# Patient Record
Sex: Male | Born: 1985 | Race: Black or African American | Hispanic: No | Marital: Single | Smoking: Never smoker
Health system: Southern US, Community
[De-identification: ages and names within clinical notes are randomized; demographics above are authoritative.]

## PROBLEM LIST (undated history)

## (undated) DIAGNOSIS — Z789 Other specified health status: Secondary | ICD-10-CM

## (undated) HISTORY — DX: Other specified health status: Z78.9

## (undated) HISTORY — PX: OTHER SURGICAL HISTORY: SHX169

---

## 2017-01-12 ENCOUNTER — Other Ambulatory Visit: Payer: Self-pay | Admitting: Internal Medicine

## 2017-01-12 ENCOUNTER — Ambulatory Visit
Admission: RE | Admit: 2017-01-12 | Discharge: 2017-01-12 | Disposition: A | Discharge status: Home / Self Care | Payer: No Typology Code available for payment source | Source: Ambulatory Visit | Attending: Internal Medicine | Admitting: Internal Medicine

## 2017-01-12 DIAGNOSIS — R7611 Nonspecific reaction to tuberculin skin test without active tuberculosis: Secondary | ICD-10-CM

## 2018-09-22 ENCOUNTER — Emergency Department (HOSPITAL_COMMUNITY)
Admission: EM | Admit: 2018-09-22 | Discharge: 2018-09-22 | Disposition: A | Discharge status: Home / Self Care | Payer: Self-pay | Attending: Emergency Medicine | Admitting: Emergency Medicine

## 2018-09-22 ENCOUNTER — Encounter (HOSPITAL_COMMUNITY): Payer: Self-pay | Admitting: *Deleted

## 2018-09-22 ENCOUNTER — Other Ambulatory Visit: Payer: Self-pay

## 2018-09-22 ENCOUNTER — Emergency Department (HOSPITAL_COMMUNITY): Payer: Self-pay

## 2018-09-22 DIAGNOSIS — R05 Cough: Secondary | ICD-10-CM | POA: Insufficient documentation

## 2018-09-22 DIAGNOSIS — J069 Acute upper respiratory infection, unspecified: Secondary | ICD-10-CM

## 2018-09-22 DIAGNOSIS — R059 Cough, unspecified: Secondary | ICD-10-CM

## 2018-09-22 MED ORDER — ACETAMINOPHEN 325 MG PO TABS
650.0000 mg | ORAL_TABLET | Freq: Once | ORAL | Status: AC
  Administered 2018-09-22: 650 mg via ORAL
  Filled 2018-09-22: qty 2

## 2018-09-22 NOTE — Discharge Instructions (Addendum)
Person Under Monitoring Name: Philip Bell  Location: [address] [place] [address]   Infection Prevention Recommendations for Individuals Confirmed to have, or Being Evaluated for, 2019 Novel Coronavirus (COVID-19) Infection Who Receive Care at Home  Individuals who are confirmed to have, or are being evaluated for, COVID-19 should follow the prevention steps below until a healthcare provider or local or state health department says they can return to normal activities.  Stay home except to get medical care You should restrict activities outside your home, except for getting medical care. Do not go to work, school, or public areas, and do not use public transportation or taxis.  Call ahead before visiting your doctor Before your medical appointment, call the healthcare provider and tell them that you have, or are being evaluated for, COVID-19 infection. This will help the healthcare providers office take steps to keep other people from getting infected. Ask your healthcare provider to call the local or state health department.  Monitor your symptoms Seek prompt medical attention if your illness is worsening (e.g., difficulty breathing). Before going to your medical appointment, call the healthcare provider and tell them that you have, or are being evaluated for, COVID-19 infection. Ask your healthcare provider to call the local or state health department.  Wear a facemask You should wear a facemask that covers your nose and mouth when you are in the same room with other people and when you visit a healthcare provider. People who live with or visit you should also wear a facemask while they are in the same room with you.  Separate yourself from other people in your home As much as possible, you should stay in a different room from other people in your home. Also, you should use a separate bathroom, if available.  Avoid sharing household items You should  not share dishes, drinking glasses, cups, eating utensils, towels, bedding, or other items with other people in your home. After using these items, you should wash them thoroughly with soap and water.  Cover your coughs and sneezes Cover your mouth and nose with a tissue when you cough or sneeze, or you can cough or sneeze into your sleeve. Throw used tissues in a lined trash can, and immediately wash your hands with soap and water for at least 20 seconds or use an alcohol-based hand rub.  Wash your [employer] your hands often and thoroughly with soap and water for at least 20 seconds. You can use an alcohol-based hand sanitizer if soap and water are not available and if your hands are not visibly dirty. Avoid touching your eyes, nose, and mouth with unwashed hands.   Prevention Steps for Caregivers and Household Members of Individuals Confirmed to have, or Being Evaluated for, COVID-19 Infection Being Cared for in the Home  If you live with, or provide care at home for, a person confirmed to have, or being evaluated for, COVID-19 infection please follow these guidelines to prevent infection:  Follow healthcare providers instructions Make sure that you understand and can help the patient follow any healthcare provider instructions for all care.  Provide for the patients basic needs You should help the patient with basic needs in the home and provide support for getting groceries, prescriptions, and other personal needs.  Monitor the patients symptoms If they are getting sicker, call his or her medical provider and tell them that the patient has, or is being evaluated for, COVID-19 infection. This will help the healthcare providers  office take steps to keep other people from getting infected. Ask the healthcare provider to call the local or state health department.  Limit the number of people who have contact with the patient If possible, have only one caregiver for the  patient. Other household members should stay in another home or place of residence. If this is not possible, they should stay in another room, or be separated from the patient as much as possible. Use a separate bathroom, if available. Restrict visitors who do not have an essential need to be in the home.  Keep older adults, very young children, and other sick people away from the patient Keep older adults, very young children, and those who have compromised immune systems or chronic health conditions away from the patient. This includes people with chronic heart, lung, or kidney conditions, diabetes, and cancer.  Ensure good ventilation Make sure that shared spaces in the home have good air flow, such as from an air conditioner or an opened window, weather permitting.  Wash your hands often Wash your hands often and thoroughly with soap and water for at least 20 seconds. You can use an alcohol based hand sanitizer if soap and water are not available and if your hands are not visibly dirty. Avoid touching your eyes, nose, and mouth with unwashed hands. Use disposable paper towels to dry your hands. If not available, use dedicated cloth towels and replace them when they become wet.  Wear a facemask and gloves Wear a disposable facemask at all times in the room and gloves when you touch or have contact with the patients blood, body fluids, and/or secretions or excretions, such as sweat, saliva, sputum, nasal mucus, vomit, urine, or feces.  Ensure the mask fits over your nose and mouth tightly, and do not touch it during use. Throw out disposable facemasks and gloves after using them. Do not reuse. Wash your hands immediately after removing your facemask and gloves. If your personal clothing becomes contaminated, carefully remove clothing and launder. Wash your hands after handling contaminated clothing. Place all used disposable facemasks, gloves, and other waste in a lined container before  disposing them with other household waste. Remove gloves and wash your hands immediately after handling these items.  Do not share dishes, glasses, or other household items with the patient Avoid sharing household items. You should not share dishes, drinking glasses, cups, eating utensils, towels, bedding, or other items with a patient who is confirmed to have, or being evaluated for, COVID-19 infection. After the person uses these items, you should wash them thoroughly with soap and water.  Wash laundry thoroughly Immediately remove and wash clothes or bedding that have blood, body fluids, and/or secretions or excretions, such as sweat, saliva, sputum, nasal mucus, vomit, urine, or feces, on them. Wear gloves when handling laundry from the patient. Read and follow directions on labels of laundry or clothing items and detergent. In general, wash and dry with the warmest temperatures recommended on the label.  Clean all areas the individual has used often Clean all touchable surfaces, such as counters, tabletops, doorknobs, bathroom fixtures, toilets, phones, keyboards, tablets, and bedside tables, every day. Also, clean any surfaces that may have blood, body fluids, and/or secretions or excretions on them. Wear gloves when cleaning surfaces the patient has come in contact with. Use a diluted bleach solution (e.g., dilute bleach with 1 part bleach and 10 parts water) or a household disinfectant with a label that says EPA-registered for coronaviruses. To make a  bleach solution at home, add 1 tablespoon of bleach to 1 quart (4 cups) of water. For a larger supply, add  cup of bleach to 1 gallon (16 cups) of water. Read labels of cleaning products and follow recommendations provided on product labels. Labels contain instructions for safe and effective use of the cleaning product including precautions you should take when applying the product, such as wearing gloves or eye protection and making sure you  have good ventilation during use of the product. Remove gloves and wash hands immediately after cleaning.  Monitor yourself for signs and symptoms of illness Caregivers and household members are considered close contacts, should monitor their health, and will be asked to limit movement outside of the home to the extent possible. Follow the monitoring steps for close contacts listed on the symptom monitoring form.   ? If you have additional questions, contact your local health department or call the epidemiologist on call at [phone] (available 24/7). ? This guidance is subject to change. For the most up-to-date guidance from [facility], please refer to their website: [email or web address]

## 2018-09-22 NOTE — ED Triage Notes (Addendum)
Pt has cough and scratchy throat for 3days. Pt reports his room mate was seen at ED for possible COVID 19  And roommate was sent home to self isolate.  PT also reports a HA.

## 2018-09-22 NOTE — ED Provider Notes (Signed)
[facility] Provider Note   CSN: [id number] Arrival date & time: 09/22/18  1433    History   Chief Complaint Chief Complaint  Patient presents with  . Cough    HPI Philip DankerMohammed Mecum is a 33 y.o. male who presents to the ED with cc of Cough. Patient had onset of symptoms yesterday. He has associated myalgias of the legs. He has a mild headache and fatigue. He denies fever, chills, nausea, vomiting. Diarrhea, cola colored urine. His roommate has the same symptoms and was told to isolate at home for suspected COVID-19.      HPI  History reviewed. No pertinent past medical history.  There are no active problems to display for this patient.   History reviewed. No pertinent surgical history.      Home Medications    Prior to Admission medications   Not on File    Family History History reviewed. No pertinent family history.  Social History Social History   Tobacco Use  . Smoking status: Never Smoker  . Smokeless tobacco: Never Used  Substance Use Topics  . Alcohol use: Never    Frequency: Never  . Drug use: Never     Allergies   Patient has no allergy information on record.   Review of Systems Review of Systems  Ten systems reviewed and are negative for acute change, except as noted in the HPI.   Physical Exam Updated Vital Signs BP (!) 144/80 (BP Location: Right Arm)   Pulse 87   Temp 98.6 F (37 C) (Oral)   Resp 20   Ht 5\' 10"  (1.778 m)   Wt 66.7 kg   SpO2 99%   BMI 21.09 kg/m   Physical Exam Vitals signs and nursing note reviewed.  Constitutional:      General: He is not in acute distress.    Appearance: He is well-developed. He is not diaphoretic.  HENT:     Head: Normocephalic and atraumatic.  Eyes:     General: No scleral icterus.    Conjunctiva/sclera: Conjunctivae normal.  Neck:     Musculoskeletal: Normal range of motion and neck supple.  Cardiovascular:     Rate and Rhythm: Normal rate  and regular rhythm.     Heart sounds: Normal heart sounds.  Pulmonary:     Effort: Pulmonary effort is normal. No respiratory distress.     Breath sounds: Normal breath sounds. No stridor. No wheezing, rhonchi or rales.  Chest:     Chest wall: No tenderness.  Abdominal:     Palpations: Abdomen is soft.     Tenderness: There is no abdominal tenderness.  Skin:    General: Skin is warm and dry.  Neurological:     Mental Status: He is alert.  Psychiatric:        Behavior: Behavior normal.      ED Treatments / Results  Labs (all labs ordered are listed, but only abnormal results are displayed) Labs Reviewed - No data to display  EKG None  Radiology No results found.  Procedures Procedures (including critical care time)  Medications Ordered in ED Medications - No data to display   Initial Impression / Assessment and Plan / ED Course  I have reviewed the triage vital signs and the nursing notes.  Pertinent labs & imaging results that were available during my care of the patient were reviewed by me and considered in my medical decision making (see chart for details).  Philip Bell was evaluated in Emergency Department on 09/22/2018 for the symptoms described in the history of present illness. He was evaluated in the context of the global COVID-19 pandemic, which necessitated consideration that the patient might be at risk for infection with the SARS-CoV-2 virus that causes COVID-19. Institutional protocols and algorithms that pertain to the evaluation of patients at risk for COVID-19 are in a state of rapid change based on information released by regulatory bodies including the CDC and federal and state organizations. These policies and algorithms were followed during the patient's care in the ED.  Pt CXR negative for acute infiltrate. Patients symptoms are consistent with URI, likely viral etiology. Discussed that antibiotics are not indicated for viral infections.  Pt will be discharged with symptomatic treatment.  Verbalizes understanding and is agreeable with plan. Pt is hemodynamically stable & in NAD prior to dc. Given self isolation precautions Final Clinical Impressions(s) / ED Diagnoses   Final diagnoses:  Cough    ED Discharge Orders    None       Arthor Captain, PA-C 09/22/18 2251    Wynetta Fines, MD 09/23/18 1901

## 2018-09-22 NOTE — ED Notes (Signed)
Patient verbalizes understanding of discharge instructions . Opportunity for questions and answers were provided . Armband removed by staff ,Pt discharged from ED. W/C  offered at D/C  and Declined W/C at D/C and was escorted to lobby by RN.  

## 2018-10-14 ENCOUNTER — Telehealth: Payer: Self-pay

## 2018-10-14 DIAGNOSIS — Z20822 Contact with and (suspected) exposure to covid-19: Secondary | ICD-10-CM

## 2018-10-14 NOTE — Congregational Nurse Program (Signed)
  Dept: [phone]   Congregational Nurse Program Note  Date of Encounter: 10/14/2018  Past Medical History: No past medical history on file.  Encounter Details: CNP Questionnaire - 10/14/18 1709      Questionnaire   Patient Status  Refugee    Race  African    Location Patient Served At  [employer]    Uninsured  Not Applicable    Food  No food insecurities    Housing/Utilities  Yes, have permanent housing    Transportation  No transportation needs    Interpersonal Safety  Yes, feel physically and emotionally safe where you currently live    Medication  No medication insecurities    Medical Provider  No    Referrals  Area Agency    ED Visit Averted  Yes    Life-Saving Intervention Made  Not Applicable

## 2018-10-14 NOTE — Congregational Nurse Program (Signed)
Patient called with information regarding Covid 19 testing and verbalized understanding.  Arman Bogus RN BSN PCCN [phone]

## 2018-10-14 NOTE — Telephone Encounter (Signed)
Philip Bell has called with concerns related to Covid 19 testing. He states that he has been exposed by his roommate who tested positive for Covid 19. He is complaining of headaches, cough, body weakness and diarrhea. He denies SOB or chest pain.He does not report a fever. I have called [employer] and scheduled a testing appointment tomorrow at [phone]. He understands to call 911 or go ER if symptoms worsen. Arman Bogus RN BSN PCCN. [phone]

## 2018-10-14 NOTE — Telephone Encounter (Signed)
Arman Bogus, RN [facility] called and says the patient has been having symptoms of COVID-19 for 2-3 weeks-headache, cough, body weakness; diarrhea x 2 days. She says she would like him to be tested for COVID-19, since he wasn't tested at the ED about 3 weeks ago. Appointment scheduled for tomorrow, 10/15/18 at 0900 at [facility]. She says she is working on getting him a PCP, so in the meantime if we could call her with the results, to relay to the patient. CB#[phone].

## 2018-10-15 ENCOUNTER — Other Ambulatory Visit: Payer: PRIVATE HEALTH INSURANCE

## 2018-10-15 DIAGNOSIS — Z20822 Contact with and (suspected) exposure to covid-19: Secondary | ICD-10-CM

## 2018-10-16 ENCOUNTER — Telehealth: Payer: Self-pay

## 2018-10-16 NOTE — Telephone Encounter (Signed)
I have called Mr Philip Bell to check on him. He is waiting results for Covid 19 and reports to be still having a headache nausea and poor apetitte.I have educated him on keeping rehydrated, eat small frequent meals and to take over the counter analgesics for pain. He understands to call 911 if he developes shortness of breath or chest pain. Arman Bogus RN BSN PCCN [phone]

## 2018-10-17 LAB — NOVEL CORONAVIRUS, NAA: SARS-CoV-2, NAA: NOT DETECTED

## 2018-10-18 ENCOUNTER — Telehealth: Payer: Self-pay

## 2018-10-18 NOTE — Telephone Encounter (Signed)
I have received a call from [facility] with test result. I have called mr Arbutus Ped and informed him of Covid negative test results. Arman Bogus RN BSN PCCN [phone]

## 2018-10-18 NOTE — Telephone Encounter (Signed)
Dorothy Muhoro R.N. given lab results, verbalizes understanding.

## 2018-10-20 ENCOUNTER — Telehealth: Payer: Self-pay

## 2018-10-20 NOTE — Telephone Encounter (Signed)
Philip Bell has tested negative for Covid 19 and can return to work. Arman Bogus RN BSN PCCN  [phone]

## 2018-11-02 ENCOUNTER — Telehealth: Payer: Self-pay

## 2018-11-02 NOTE — Telephone Encounter (Signed)
Mr Philip Bell called with questions regarding his medical bills. I have explained the bills to him and verbalized understanding. Honor Loh RN BSN PCCN [phone]

## 2018-12-26 ENCOUNTER — Other Ambulatory Visit: Payer: Self-pay

## 2019-06-13 ENCOUNTER — Telehealth: Payer: Self-pay

## 2019-06-13 NOTE — Telephone Encounter (Signed)
I have called [facility] to establish primary care. Appointment scheduled for February 12th at 0850 am. I have called and informed Philip Bell of the same. Arman Bogus RN BSN PCCN [phone]

## 2019-06-17 ENCOUNTER — Other Ambulatory Visit: Payer: Self-pay

## 2019-07-11 ENCOUNTER — Encounter: Payer: Self-pay | Admitting: Family Medicine

## 2019-07-11 ENCOUNTER — Other Ambulatory Visit (HOSPITAL_COMMUNITY)
Admission: RE | Admit: 2019-07-11 | Discharge: 2019-07-11 | Disposition: A | Discharge status: Home / Self Care | Payer: BC Managed Care – PPO | Source: Ambulatory Visit | Attending: Family Medicine | Admitting: Family Medicine

## 2019-07-11 ENCOUNTER — Other Ambulatory Visit: Payer: Self-pay

## 2019-07-11 ENCOUNTER — Ambulatory Visit (HOSPITAL_BASED_OUTPATIENT_CLINIC_OR_DEPARTMENT_OTHER): Payer: BC Managed Care – PPO | Admitting: Family Medicine

## 2019-07-11 VITALS — BP 130/78 | HR 86 | Temp 97.3°F | Resp 18 | Ht 72.0 in | Wt 159.0 lb

## 2019-07-11 DIAGNOSIS — Z113 Encounter for screening for infections with a predominantly sexual mode of transmission: Secondary | ICD-10-CM | POA: Insufficient documentation

## 2019-07-11 DIAGNOSIS — R079 Chest pain, unspecified: Secondary | ICD-10-CM

## 2019-07-11 DIAGNOSIS — R0789 Other chest pain: Secondary | ICD-10-CM

## 2019-07-11 DIAGNOSIS — N4889 Other specified disorders of penis: Secondary | ICD-10-CM

## 2019-07-11 NOTE — Progress Notes (Signed)
Patient experienced chest pains as a teenager due to the strenuous work he did in [place]. Patient shares intermittently when lifting a box at work he feels a little chest pain.  Patient would like to be tested for "bump" flare ups in his genital area. Patient states the areas do not hurt, they itch intermittently and are dry. Patient denies discharge from penial.

## 2019-07-11 NOTE — Progress Notes (Signed)
Subjective:  Patient ID: Philip Bell, male    DOB: May 27, 1986  Age: 34 y.o. MRN: [id number]  CC: Establish Care   HPI Philip Bell , 34 yo male who presents to establish care.  He reports episodes of chest pain- mid chest, which occur after strenuous work. He works in [employer] and also is attending college. When he has chest pain, it is about a 6-7 on a 0-10 scale and lasts for about 3-4 hours. Pain goes away with use of tylenol or ibuprofen or goes away on its own. No other symptoms such as nausea or sweating with chest pain.  He reports similar chest pain when he was 15 and did strenuous work which involved heavy lifting.  He can sometimes reproduce the chest pain by pressing on the chest wall on either side of the ribs.  No chest pain at today's visit, no palpitations, no history of syncope/passing out.       He also reports that he has sores in his genital area on the head of the penis which are non-painful and come and go. He would like to be checked for STD's. No dysuria and no penile discharge. No abdominal pain or pelvic pain and no fever or chills. No current sores or skin changes in the genital area.   Past Medical History:  Diagnosis Date  . Known health problems: none     Past Surgical History:  Procedure Laterality Date  . no past surgery      Family History  Problem Relation Age of Onset  . Hypertension Paternal Grandfather   . Cancer Neg Hx   . Heart disease Neg Hx   . Diabetes Neg Hx     Social History   Tobacco Use  . Smoking status: Never Smoker  . Smokeless tobacco: Never Used  Substance Use Topics  . Alcohol use: Never    ROS Review of Systems  Constitutional: Positive for fatigue (Occasional as he works and attends college). Negative for chills and fever.  Eyes: Positive for visual disturbance (Occasional sensation of headache and eye strain with prolonged reading/use of computer). Negative for photophobia.  Respiratory: Negative  for cough and shortness of breath.   Cardiovascular: Positive for chest pain. Negative for palpitations and leg swelling.  Gastrointestinal: Negative for abdominal pain, blood in stool, constipation, diarrhea and nausea.  Endocrine: Negative for cold intolerance, heat intolerance, polydipsia, polyphagia and polyuria.  Genitourinary: Positive for genital sores (Not currently but has a skin lesion which goes and comes). Negative for discharge, dysuria, flank pain, frequency, penile pain, penile swelling, scrotal swelling and testicular pain.  Musculoskeletal: Positive for back pain (Occasionally at the end of the workday) and myalgias (Occasionally at the end of the workday).  Neurological: Positive for headaches (Occasional with prolonged reading or prolonged computer use). Negative for dizziness.  Hematological: Negative for adenopathy. Does not bruise/bleed easily.  Psychiatric/Behavioral: Negative for self-injury, sleep disturbance and suicidal ideas. The patient is not nervous/anxious.     Objective:   Today's Vitals: BP 130/78 (BP Location: Left Arm, Patient Position: Sitting, Cuff Size: Large)   Pulse 86   Temp (!) 97.3 F (36.3 C) (Oral)   Resp 18   Ht 6' (1.829 m)   Wt 159 lb (72.1 kg)   SpO2 100%   BMI 21.56 kg/m   Physical Exam Vitals and nursing note reviewed.  Constitutional:      Appearance: Normal appearance. He is normal weight.  Neck:     Vascular:  No carotid bruit.  Cardiovascular:     Rate and Rhythm: Normal rate and regular rhythm.  Pulmonary:     Effort: Pulmonary effort is normal.     Breath sounds: Normal breath sounds.  Abdominal:     Tenderness: There is no abdominal tenderness. There is no right CVA tenderness, left CVA tenderness, guarding or rebound.  Genitourinary:    Comments: Exam deferred as he reports no current skin lesions in the genital area Musculoskeletal:        General: No tenderness or deformity.     Cervical back: Normal range of motion  and neck supple. No tenderness.     Right lower leg: No edema.     Left lower leg: No edema.     Comments: No reproducible chest wall pain with palpation or with having patient perform movements which cause stretching of the chest wall.  No CVA tenderness  Lymphadenopathy:     Cervical: No cervical adenopathy.  Skin:    General: Skin is warm and dry.  Neurological:     General: No focal deficit present.     Mental Status: He is alert and oriented to person, place, and time.  Psychiatric:        Mood and Affect: Mood normal.        Behavior: Behavior normal.        Thought Content: Thought content normal.     Assessment & Plan:  1. Chest pain, unspecified type Patient reports recurrent episodes of chest pain which generally occur at the end of the workday and are relieved with over-the-counter pain medication.  Discussed with the patient that based on his description of location of pain and that the pain is relieved with over-the-counter pain medication that his pain is most likely musculoskeletal in origin.  He can continue the use of over-the-counter medication as needed if pain reoccurs.  EKG done at today's visit shows normal sinus rhythm with mild early repolarization which would not contribute to patient's chest pain.  We will also check basic metabolic panel to make sure that electrolytes are within normal and check CBC to rule out anemia as possible cause of patient's chest pain. - EKG 16-WVPX - Basic Metabolic Panel - CBC  2. Screening for STDs (sexually transmitted diseases) He will have testing for gonorrhea, chlamydia, trichomonas, syphilis and HIV.  He has been asked to return to clinic if he has recurrence of active sores so that these areas can be visualized especially if all other testing is normal.  He will be notified of the test results and if any further treatment or evaluation is needed based on these results. - Urine cytology ancillary only - HIV antibody (with  reflex) - RPR  Outpatient Encounter Medications as of 07/11/2019  Medication Sig  . Ascorbic Acid (VITAMIN C) 100 MG tablet Take 100 mg by mouth daily.  . Multiple Vitamin (MULTIVITAMIN) capsule Take 1 capsule by mouth daily.   No facility-administered encounter medications on file as of 07/11/2019.    An After Visit Summary was printed and given to the patient.   Follow-up: Return in about 4 weeks (around 08/08/2019) for chest pain- sooner if needed.    Antony Blackbird MD

## 2019-07-12 LAB — CBC
Hematocrit: 47.5 % (ref 37.5–51.0)
Hemoglobin: 16 g/dL (ref 13.0–17.7)
MCH: 27.8 pg (ref 26.6–33.0)
MCHC: 33.7 g/dL (ref 31.5–35.7)
MCV: 83 fL (ref 79–97)
Platelets: 130 x10E3/uL — ABNORMAL LOW (ref 150–450)
RBC: 5.75 x10E6/uL (ref 4.14–5.80)
RDW: 12.6 % (ref 11.6–15.4)
WBC: 3.2 x10E3/uL — ABNORMAL LOW (ref 3.4–10.8)

## 2019-07-12 LAB — BASIC METABOLIC PANEL WITH GFR
BUN/Creatinine Ratio: 14 (ref 9–20)
BUN: 16 mg/dL (ref 6–20)
CO2: 20 mmol/L (ref 20–29)
Calcium: 9.7 mg/dL (ref 8.7–10.2)
Chloride: 104 mmol/L (ref 96–106)
Creatinine, Ser: 1.17 mg/dL (ref 0.76–1.27)
GFR calc Af Amer: 93 mL/min/1.73
GFR calc non Af Amer: 81 mL/min/1.73
Glucose: 91 mg/dL (ref 65–99)
Potassium: 4.4 mmol/L (ref 3.5–5.2)
Sodium: 140 mmol/L (ref 134–144)

## 2019-07-12 LAB — HIV ANTIBODY (ROUTINE TESTING W REFLEX): HIV Screen 4th Generation wRfx: NONREACTIVE

## 2019-07-12 LAB — SYPHILIS: RPR W/REFLEX TO RPR TITER AND TREPONEMAL ANTIBODIES, TRADITIONAL SCREENING AND DIAGNOSIS ALGORITHM: RPR Ser Ql: NONREACTIVE

## 2019-07-14 LAB — URINE CYTOLOGY ANCILLARY ONLY
Chlamydia: NEGATIVE
Comment: NEGATIVE
Comment: NEGATIVE
Comment: NORMAL
Neisseria Gonorrhea: NEGATIVE
Trichomonas: NEGATIVE

## 2019-08-08 ENCOUNTER — Ambulatory Visit: Payer: BC Managed Care – PPO | Admitting: Family Medicine

## 2019-08-08 ENCOUNTER — Other Ambulatory Visit: Payer: Self-pay

## 2019-08-08 ENCOUNTER — Encounter: Payer: Self-pay | Admitting: Family

## 2019-08-08 ENCOUNTER — Ambulatory Visit: Payer: BC Managed Care – PPO | Attending: Family Medicine | Admitting: Family

## 2019-08-08 VITALS — BP 112/70 | HR 72 | Temp 96.6°F | Ht 72.0 in | Wt 157.0 lb

## 2019-08-08 DIAGNOSIS — R0789 Other chest pain: Secondary | ICD-10-CM

## 2019-08-08 DIAGNOSIS — R079 Chest pain, unspecified: Secondary | ICD-10-CM

## 2019-08-08 NOTE — Patient Instructions (Signed)
Follow-up with attending physician as needed or report to emergency department if severe. Chest Wall Pain Chest wall pain is pain in or around the bones and muscles of your chest. Chest wall pain may be caused by:  An injury.  Coughing a lot.  Using your chest and arm muscles too much. Sometimes, the cause may not be known. This pain may take a few weeks or longer to get better. Follow these instructions at home: Managing pain, stiffness, and swelling If told, put ice on the painful area:  Put ice in a plastic bag.  Place a towel between your skin and the bag.  Leave the ice on for 20 minutes, 2-3 times a day.  Activity  Rest as told by your doctor.  Avoid doing things that cause pain. This includes lifting heavy items.  Ask your doctor what activities are safe for you. General instructions   Take over-the-counter and prescription medicines only as told by your doctor.  Do not use any products that contain nicotine or tobacco, such as cigarettes, e-cigarettes, and chewing tobacco. If you need help quitting, ask your doctor.  Keep all follow-up visits as told by your doctor. This is important. Contact a doctor if:  You have a fever.  Your chest pain gets worse.  You have new symptoms. Get help right away if:  You feel sick to your stomach (nauseous) or you throw up (vomit).  You feel sweaty or light-headed.  You have a cough with mucus from your lungs (sputum) or you cough up blood.  You are short of breath. These symptoms may be an emergency. Do not wait to see if the symptoms will go away. Get medical help right away. Call your local emergency services (911 in the [place] Do not drive yourself to the hospital. Summary  Chest wall pain is pain in or around the bones and muscles of your chest.  It may be treated with ice, rest, and medicines. Your condition may also get better if you avoid doing things that cause pain.  Contact a doctor if you have a fever,  chest pain that gets worse, or new symptoms.  Get help right away if you feel light-headed or you get short of breath. These symptoms may be an emergency. This information is not intended to replace advice given to you by your health care provider. Make sure you discuss any questions you have with your health care provider. Document Revised: 11/15/2017 Document Reviewed: 11/15/2017 Elsevier Patient Education  2020 [employer].

## 2019-08-08 NOTE — Progress Notes (Addendum)
Patient ID: Philip Bell, male    DOB: April 20, 1986  MRN: [id number]  CC: Chest pain follow-up  Subjective: Philip Bell is a 34 y.o. male with history of no known health problems who presents for chest pain follow-up.  1. CHEST PAIN FOLLOW-UP:  Location: Denies chest pain Description: Denies Onset: Reports last occurrence of chest pain was at last visit in February 2021.   Comments: Previously taking Tylenol for chest pain management. The last time he took Tylenol was 2 weeks ago however, the medication was taken related to a tooth being removed by dentist. Last visit February 2021 with Dr. Chapman Fitch during that encounter EKG, BMP, and CBC completed. BMP and CBC were not determined to be significant factors related to chest pain. EKG during that encounter resulted normal sinus rhythm with mild early repolarization which was not determined to be contributory to patient's chest pain. Chest pain was determined to be musculoskeletal in origin. Reports that since last visit he has decreased the amount of weight that he is lifting at his job which resulted in no chest pain since then. Requests printout of labs completed during last visit.   Symptoms Trauma: Denies Nausea/vomiting: Denies Diaphoresis: Denies Shortness of breath: Denies Cough: Denies Edema: Denies Orthopnea: Denies Syncope: Denies Indigestion: Denies  Red Flags Worse with exertion: Denies  Recent Immobility: Denies Cancer history: Denies Tearing/radiation to back: Denies   Current Outpatient Medications on File Prior to Visit  Medication Sig Dispense Refill  . Ascorbic Acid (VITAMIN C) 100 MG tablet Take 100 mg by mouth daily.    . Multiple Vitamin (MULTIVITAMIN) capsule Take 1 capsule by mouth daily.     No current facility-administered medications on file prior to visit.    No Known Allergies  Social History   Socioeconomic History  . Marital status: Single    Spouse name: Not on file  . Number of  children: Not on file  . Years of education: Not on file  . Highest education level: Not on file  Occupational History  . Not on file  Tobacco Use  . Smoking status: Never Smoker  . Smokeless tobacco: Never Used  Substance and Sexual Activity  . Alcohol use: Never  . Drug use: Never  . Sexual activity: Not Currently  Other Topics Concern  . Not on file  Social History Narrative  . Not on file   Social Determinants of Health   Financial Resource Strain:   . Difficulty of Paying Living Expenses:   Food Insecurity:   . Worried About Charity fundraiser in the Last Year:   . Arboriculturist in the Last Year:   Transportation Needs:   . Film/video editor (Medical):   Marland Kitchen Lack of Transportation (Non-Medical):   Physical Activity:   . Days of Exercise per Week:   . Minutes of Exercise per Session:   Stress:   . Feeling of Stress :   Social Connections:   . Frequency of Communication with Friends and Family:   . Frequency of Social Gatherings with Friends and Family:   . Attends Religious Services:   . Active Member of Clubs or Organizations:   . Attends Archivist Meetings:   Marland Kitchen Marital Status:   Intimate Partner Violence:   . Fear of Current or Ex-Partner:   . Emotionally Abused:   Marland Kitchen Physically Abused:   . Sexually Abused:     Family History  Problem Relation Age of Onset  . Hypertension Paternal  Grandfather   . Cancer Neg Hx   . Heart disease Neg Hx   . Diabetes Neg Hx     Past Surgical History:  Procedure Laterality Date  . no past surgery      ROS: Review of Systems Negative except as stated above  PHYSICAL EXAM: Vitals with BMI 08/08/2019 07/11/2019 09/22/2018  Height 6\' 0"  6\' 0"  5\' 10"   Weight 157 lbs 159 lbs 147 lbs  BMI 21.29 21.56 21.09  Systolic 112 130  Diastolic 70 78 80  Pulse 72 86 87    Physical Exam General appearance - alert, well appearing, and in no distress and oriented to person, place, and time Mental status -  alert, oriented to person, place, and time, normal mood, behavior, speech, dress, motor activity, and thought processes Chest - clear to auscultation, no wheezes, rales or rhonchi, symmetric air entry, no tachypnea, retractions or cyanosis Heart - normal rate, regular rhythm, normal S1, S2, no murmurs, rubs, clicks or gallops Musculoskeletal - no joint tenderness, deformity or swelling  CMP Latest Ref Rng & Units 07/11/2019  Glucose 65 - 99 mg/dL 91  BUN 6 - 20 mg/dL 16  Creatinine - 725 mg/dL 09/08/2019  Sodium 3.66 - 4.40 mmol/L 140  Potassium 3.5 - 5.2 mmol/L 4.4  Chloride 96 - 106 mmol/L 104  CO2 20 - 29 mmol/L 20  Calcium 8.7 - 10.2 mg/dL 9.7   Lipid Panel  No results found for: CHOL, TRIG, HDL, CHOLHDL, VLDL, LDLCALC, LDLDIRECT  CBC    Component Value Date/Time   WBC 3.2 (L) 07/11/2019 0947   RBC 5.75 07/11/2019 0947   HGB 16.0 07/11/2019 0947   HCT 47.5 07/11/2019 0947   PLT 130 (L) 07/11/2019 0947   MCV 83 07/11/2019 0947   MCH 27.8 07/11/2019 0947   MCHC 33.7 07/11/2019 0947   RDW 12.6 07/11/2019 0947    ASSESSMENT AND PLAN: 1. Chest pain, unspecified type: Patient feeling well during office visit. Counseled patient on importance of notifying provider if chest pain returns or going to the emergency department if chest pain is severe. Sent a MyChart activation link to patient's phone via text message through Epic so that patient will be able to access requested lab results.  Patient was given the opportunity to ask questions. Patient verbalized understanding of the plan and was able to repeat key elements of the plan. Patient was given clear instructions to go to Emergency Department or return to medical center if symptoms don't improve, worsen, or new problems develop.The patient verbalized understanding.   Requested Prescriptions    No prescriptions requested or ordered in this encounter    Adeja Sarratt 09/08/2019, NP

## 2019-08-26 ENCOUNTER — Telehealth: Payer: Self-pay

## 2019-08-26 NOTE — Telephone Encounter (Signed)
Client called requested covid vaccine.Appointment set up for Thursday April 1st at [facility] RN [facility] [phone]

## 2019-08-28 ENCOUNTER — Ambulatory Visit: Payer: BC Managed Care – PPO | Attending: Internal Medicine

## 2019-08-28 DIAGNOSIS — Z23 Encounter for immunization: Secondary | ICD-10-CM

## 2019-08-28 NOTE — Progress Notes (Signed)
   Covid-19 Vaccination Clinic  Name:  Levert Heslop    MRN: [id number] DOB: 1985-10-02  08/28/2019  Mr. Majeed was observed post Covid-19 immunization for 15 minutes without incident. He was provided with Vaccine Information Sheet and instruction to access the V-Safe system.   Mr. Shovlin was instructed to call 911 with any severe reactions post vaccine: Marland Kitchen Difficulty breathing  . Swelling of face and throat  . A fast heartbeat  . A bad rash all over body  . Dizziness and weakness   Immunizations Administered    Name Date Dose VIS Date Route   Pfizer COVID-19 Vaccine 08/28/2019  4:15 PM 0.3 mL 05/09/2019 Intramuscular   Manufacturer: [employer], [employer]   Lot: [id number]   NDC: [address]-8

## 2019-09-16 ENCOUNTER — Telehealth: Payer: Self-pay

## 2019-09-16 NOTE — Telephone Encounter (Signed)
Mr Elison called me with questions regarding medical bills. I have called [facility] patient accounting but unable to connect with Mr Kinsey for authorization.I will attempt later. Arman Bogus RN BSN PCCN [phone]

## 2019-09-23 ENCOUNTER — Ambulatory Visit: Payer: BC Managed Care – PPO | Attending: Internal Medicine

## 2019-09-23 DIAGNOSIS — Z23 Encounter for immunization: Secondary | ICD-10-CM

## 2019-09-23 NOTE — Progress Notes (Signed)
   Covid-19 Vaccination Clinic  Name:  Philip Bell    MRN: [id number] DOB: 1986/02/23  09/23/2019  [name] was observed post Covid-19 immunization for 15 minutes without incident. He was provided with Vaccine Information Sheet and instruction to access the V-Safe system.   [name] was instructed to call 911 with any severe reactions post vaccine: Marland Kitchen Difficulty breathing  . Swelling of face and throat  . A fast heartbeat  . A bad rash all over body  . Dizziness and weakness   Immunizations Administered    Name Date Dose VIS Date Route   Pfizer COVID-19 Vaccine 09/23/2019  3:55 PM 0.3 mL 07/23/2018 Intramuscular   Manufacturer: [employer], [employer]   Lot: [id number]   NDC: [address]-6

## 2020-12-13 IMAGING — DX PORTABLE CHEST - 1 VIEW
1 series · 1 of 1 positions shown · non-contrast
Comparison: 01/12/2017

CLINICAL DATA: Cough and scratchy throat

EXAM:
PORTABLE CHEST 1 VIEW

[chest]
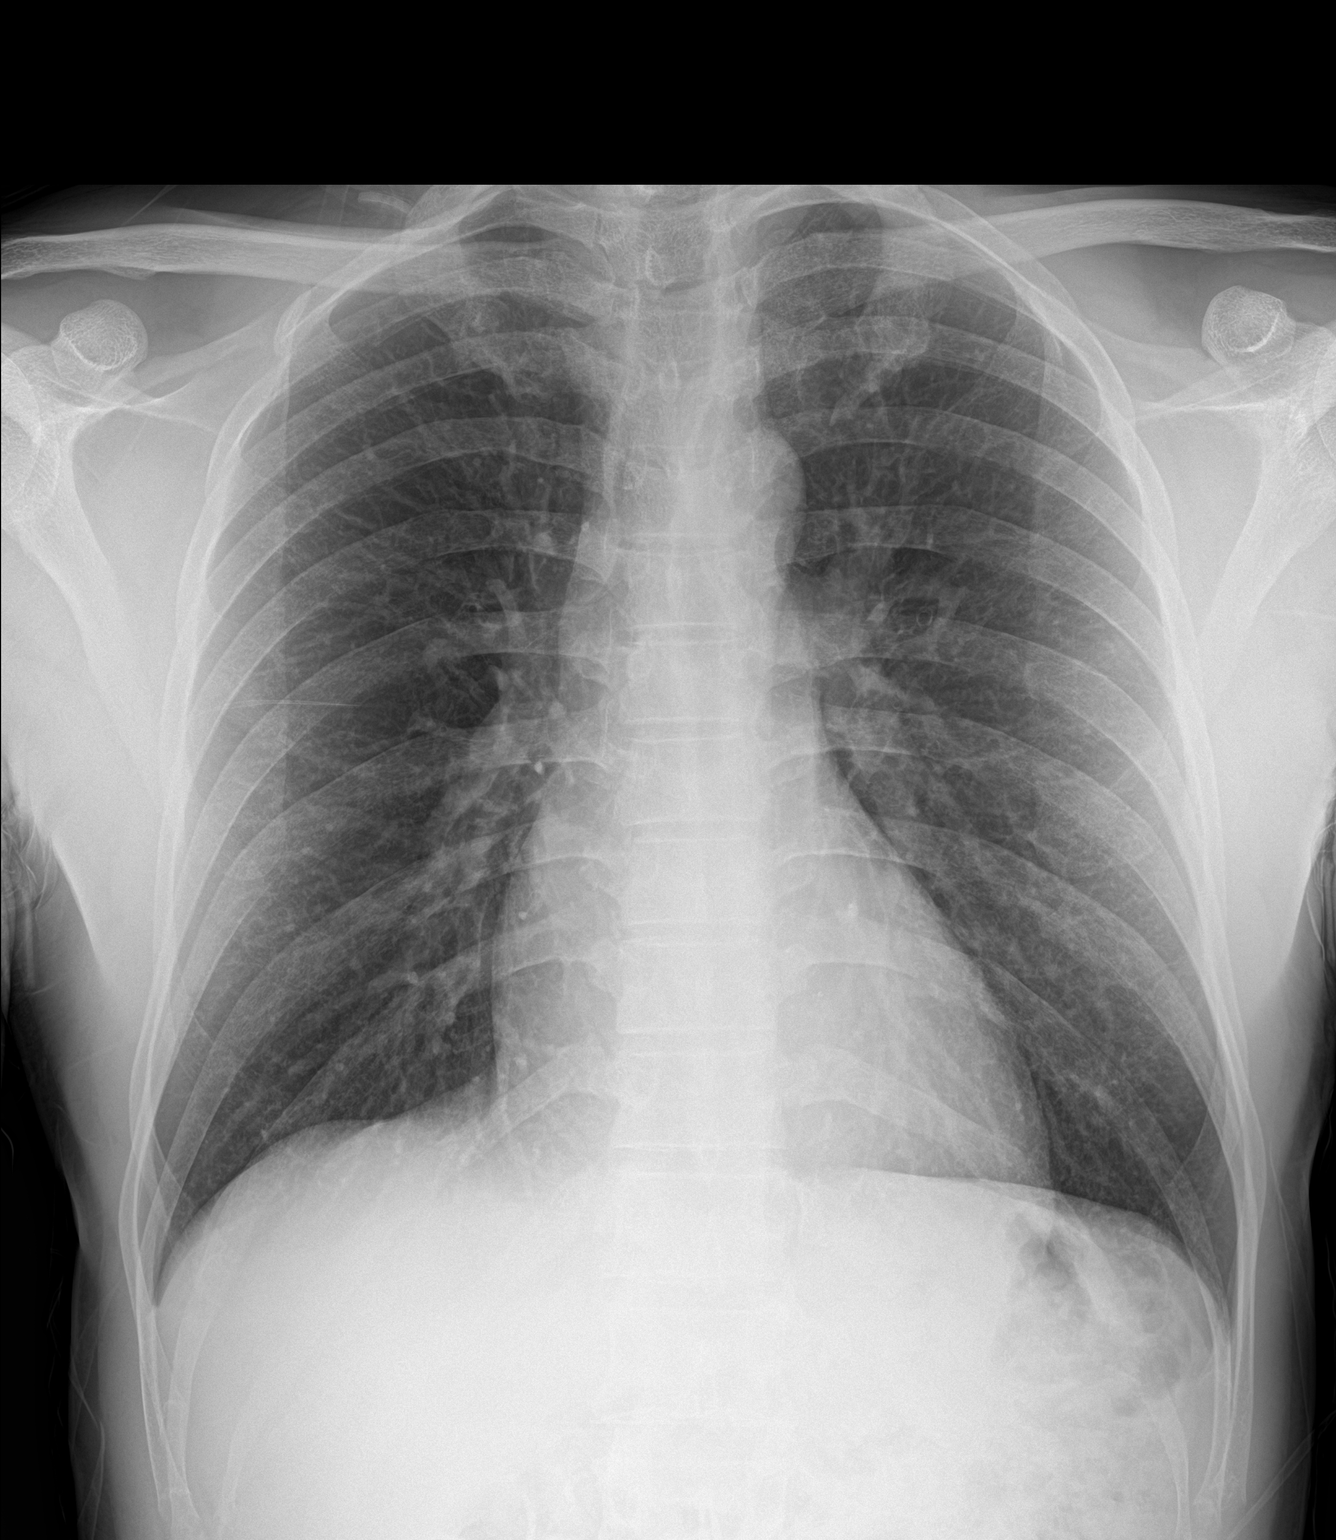

[1 of 1 positions shown; findings below may reference images not displayed]

FINDINGS: The heart size and mediastinal contours are within normal limits.
Both lungs are clear. The visualized skeletal structures are
unremarkable.
IMPRESSION: No active disease.

## 2022-02-13 DIAGNOSIS — Z13220 Encounter for screening for lipoid disorders: Secondary | ICD-10-CM | POA: Diagnosis not present

## 2022-02-13 DIAGNOSIS — K219 Gastro-esophageal reflux disease without esophagitis: Secondary | ICD-10-CM | POA: Diagnosis not present

## 2022-02-13 DIAGNOSIS — Z131 Encounter for screening for diabetes mellitus: Secondary | ICD-10-CM | POA: Diagnosis not present

## 2022-03-13 DIAGNOSIS — K219 Gastro-esophageal reflux disease without esophagitis: Secondary | ICD-10-CM | POA: Diagnosis not present

## 2022-03-13 DIAGNOSIS — B355 Tinea imbricata: Secondary | ICD-10-CM | POA: Diagnosis not present

## 2022-05-02 ENCOUNTER — Other Ambulatory Visit: Payer: Self-pay | Admitting: Internal Medicine

## 2022-05-02 DIAGNOSIS — E559 Vitamin D deficiency, unspecified: Secondary | ICD-10-CM | POA: Diagnosis not present

## 2022-05-02 DIAGNOSIS — K219 Gastro-esophageal reflux disease without esophagitis: Secondary | ICD-10-CM | POA: Diagnosis not present

## 2022-05-02 DIAGNOSIS — D696 Thrombocytopenia, unspecified: Secondary | ICD-10-CM | POA: Diagnosis not present

## 2022-05-02 DIAGNOSIS — Z113 Encounter for screening for infections with a predominantly sexual mode of transmission: Secondary | ICD-10-CM | POA: Diagnosis not present

## 2022-05-03 LAB — VITAMIN D 25 HYDROXY (VIT D DEFICIENCY, FRACTURES): Vit D, 25-Hydroxy: 67 ng/mL (ref 30–100)

## 2022-05-03 LAB — CBC WITH DIFFERENTIAL/PLATELET
Absolute Monocytes: 200 cells/uL (ref 200–950)
Basophils Absolute: 9 cells/uL (ref 0–200)
Basophils Relative: 0.3 %
Eosinophils Absolute: 20 cells/uL (ref 15–500)
Eosinophils Relative: 0.7 %
HCT: 43.9 % (ref 38.5–50.0)
Hemoglobin: 14.7 g/dL (ref 13.2–17.1)
Lymphs Abs: 1360 cells/uL (ref 850–3900)
MCH: 27.4 pg (ref 27.0–33.0)
MCHC: 33.5 g/dL (ref 32.0–36.0)
MCV: 81.8 fL (ref 80.0–100.0)
MPV: 11.6 fL (ref 7.5–12.5)
Monocytes Relative: 6.9 %
Neutro Abs: 1311 cells/uL — ABNORMAL LOW (ref 1500–7800)
Neutrophils Relative %: 45.2 %
Platelets: 141 10*3/uL (ref 140–400)
RBC: 5.37 10*6/uL (ref 4.20–5.80)
RDW: 12.4 % (ref 11.0–15.0)
Total Lymphocyte: 46.9 %
WBC: 2.9 10*3/uL — ABNORMAL LOW (ref 3.8–10.8)

## 2022-05-03 LAB — RPR: RPR Ser Ql: NONREACTIVE

## 2022-05-03 LAB — HIV ANTIBODY (ROUTINE TESTING W REFLEX): HIV 1&2 Ab, 4th Generation: NONREACTIVE

## 2024-06-29 ENCOUNTER — Telehealth: Payer: Self-pay

## 2024-06-29 NOTE — Telephone Encounter (Signed)
 Spoke directly with patient advising of new appointments date and time, he requested to cancel at this time due to school engagements and promised to call the clinic at his earliest convenience to schedule at a date and time that works best for him. Clinic call back number provided to patient.

## 2024-06-30 ENCOUNTER — Inpatient Hospital Stay

## 2024-06-30 ENCOUNTER — Inpatient Hospital Stay: Admitting: Oncology

## 2024-07-04 ENCOUNTER — Inpatient Hospital Stay

## 2024-07-05 ENCOUNTER — Inpatient Hospital Stay: Admitting: Oncology
